# Patient Record
Sex: Female | Born: 1965 | Race: White | Hispanic: No | Marital: Married
Health system: Southern US, Community
[De-identification: ages and names within clinical notes are randomized; demographics above are authoritative.]

---

## 2018-02-08 ENCOUNTER — Emergency Department (INDEPENDENT_AMBULATORY_CARE_PROVIDER_SITE_OTHER): Payer: Managed Care, Other (non HMO)

## 2018-02-08 ENCOUNTER — Telehealth: Payer: Self-pay

## 2018-02-08 ENCOUNTER — Other Ambulatory Visit: Payer: Self-pay

## 2018-02-08 ENCOUNTER — Emergency Department (INDEPENDENT_AMBULATORY_CARE_PROVIDER_SITE_OTHER)
Admission: EM | Admit: 2018-02-08 | Discharge: 2018-02-08 | Disposition: A | Discharge status: Home / Self Care | Payer: Managed Care, Other (non HMO) | Source: Home / Self Care | Attending: Emergency Medicine | Admitting: Emergency Medicine

## 2018-02-08 DIAGNOSIS — R05 Cough: Secondary | ICD-10-CM | POA: Diagnosis not present

## 2018-02-08 DIAGNOSIS — R69 Illness, unspecified: Secondary | ICD-10-CM

## 2018-02-08 DIAGNOSIS — R5383 Other fatigue: Secondary | ICD-10-CM

## 2018-02-08 DIAGNOSIS — R509 Fever, unspecified: Secondary | ICD-10-CM

## 2018-02-08 DIAGNOSIS — R822 Biliuria: Secondary | ICD-10-CM

## 2018-02-08 DIAGNOSIS — R5381 Other malaise: Secondary | ICD-10-CM | POA: Diagnosis not present

## 2018-02-08 DIAGNOSIS — J111 Influenza due to unidentified influenza virus with other respiratory manifestations: Secondary | ICD-10-CM

## 2018-02-08 LAB — POCT URINALYSIS DIP (MANUAL ENTRY)
Blood, UA: NEGATIVE
Glucose, UA: NEGATIVE mg/dL
Ketones, POC UA: NEGATIVE mg/dL
LEUKOCYTES UA: NEGATIVE
Nitrite, UA: NEGATIVE
PH UA: 8.5 — AB (ref 5.0–8.0)
Protein Ur, POC: 30 mg/dL — AB
Spec Grav, UA: 1.02 (ref 1.010–1.025)
Urobilinogen, UA: 0.2 E.U./dL

## 2018-02-08 LAB — POCT INFLUENZA A/B
INFLUENZA B, POC: NEGATIVE
Influenza A, POC: NEGATIVE

## 2018-02-08 LAB — POCT RAPID STREP A (OFFICE): Rapid Strep A Screen: NEGATIVE

## 2018-02-08 MED ORDER — OSELTAMIVIR PHOSPHATE 75 MG PO CAPS: 75.0000 mg | ORAL_CAPSULE | Freq: Two times a day (BID) | ORAL | 0 refills | Status: AC

## 2018-02-08 MED ORDER — BENZONATATE 100 MG PO CAPS: 100.0000 mg | ORAL_CAPSULE | Freq: Three times a day (TID) | ORAL | 0 refills | Status: AC

## 2018-02-08 NOTE — Telephone Encounter (Signed)
Per Dr Ellis Parents request, called patient to request that she make an appointment with PCP Monday to have a UA repeated.  Pt agreed to call PCP and go in for test.

## 2018-02-08 NOTE — ED Triage Notes (Signed)
Started Wednesday with Fatigue, and fever.  Felt a little better yesterday, and worse today,

## 2018-02-08 NOTE — Discharge Instructions (Addendum)
Take Tamiflu as instructed. If you have worsening shortness of breath or chest pain please present to the emergency room. Take Tylenol for fever. You have Tessalon Perles for cough. Please have your urine rechecked next week to be sure the bilirubinuria has resolved.

## 2018-02-08 NOTE — ED Provider Notes (Signed)
[facility]    CSN: [id number] Arrival date & time: 02/08/18  [id number]     History   Chief Complaint Chief Complaint  Patient presents with  . Fever  . Fatigue    HPI Diane Leach is a 53 y.o. female.    Fever  Associated symptoms: chills, cough and sore throat   Patient has been traveling.  She was seen last week diagnosed with urinary tract infection and placed on Macrobid for 10 days.  She enters today with onset Wednesday afternoon of aching fever sore throat and a dry nonproductive cough.  She did not have a flu shot this year.  [place] flew back on Wednesday from a [place] in [place].  History reviewed. No pertinent past medical history.  There are no active problems to display for this patient.   History reviewed. No pertinent surgical history.  OB History   No obstetric history on file.      Home Medications    Prior to Admission medications   Medication Sig Start Date End Date Taking? Authorizing Provider  Cholecalciferol (VITAMIN D3) 25 MCG (1000 UT) CAPS Take by mouth. 09/04/17  Yes [provider]  fenofibrate (TRICOR) 145 MG tablet Take by mouth. 10/05/17  Yes [provider]  benzonatate (TESSALON) 100 MG capsule Take 1 capsule (100 mg total) by mouth every 8 (eight) hours. 02/08/18   Collene Gobbleaub, Steven A, MD  oseltamivir (TAMIFLU) 75 MG capsule Take 1 capsule (75 mg total) by mouth every 12 (twelve) hours. 02/08/18   Collene Gobbleaub, Steven A, MD  Pregnenolone Micronized POWD by Does not apply route.    [provider]  thyroid (ARMOUR) 65 MG tablet Take by mouth.    [provider]  Zinc Methionate 50 MG CAPS Take by mouth.    [provider]    Family History History reviewed. No pertinent family history.  Social History Social History   Tobacco Use  . Smoking status: Not on file  Substance Use Topics  . Alcohol use: Not on file  . Drug use: Not on file     Allergies   Patient has no allergy information on  record.   Review of Systems Review of Systems  Constitutional: Positive for chills and fever.  HENT: Positive for sore throat.   Eyes: Negative.   Respiratory: Positive for cough. Negative for shortness of breath.      Physical Exam Triage Vital Signs ED Triage Vitals  Enc Vitals Group     BP 02/08/18 0929 133/67     Pulse Rate 02/08/18 0929 (!) 113     Resp 02/08/18 0929 18     Temp 02/08/18 0929 99 F (37.2 C)     Temp Source 02/08/18 0929 Oral     SpO2 02/08/18 0929 97 %     Weight 02/08/18 0930 178 lb (80.7 kg)     Height 02/08/18 0930 5\' 9"  (1.753 m)     Head Circumference --      Peak Flow --      Pain Score 02/08/18 0930 8     Pain Loc --      Pain Edu? --      Excl. in GC? --    No data found.  Updated Vital Signs BP 133/67 (BP Location: Right Arm)   Pulse (!) 113   Temp 99 F (37.2 C) (Oral)   Resp 18   Ht 5\' 9"  (1.753 m)   Wt 80.7 kg   LMP  02/03/2018   SpO2 97%   BMI 26.29 kg/m   Visual Acuity Right Eye Distance:   Left Eye Distance:   Bilateral Distance:    Right Eye Near:   Left Eye Near:    Bilateral Near:     Physical Exam Constitutional:      Appearance: Normal appearance.  HENT:     Right Ear: Tympanic membrane normal.     Left Ear: Tympanic membrane normal.     Nose: Nose normal.     Mouth/Throat:     Mouth: Mucous membranes are moist.     Pharynx: Oropharynx is clear.  Eyes:     Pupils: Pupils are equal, round, and reactive to light.  Cardiovascular:     Rate and Rhythm: Normal rate and regular rhythm.  Pulmonary:     Effort: Pulmonary effort is normal.     Breath sounds: Normal breath sounds. No rales.  Neurological:     Mental Status: She is alert.      UC Treatments / Results  Labs (all labs ordered are listed, but only abnormal results are displayed) Labs Reviewed  POCT URINALYSIS DIP (MANUAL ENTRY) - Abnormal; Notable for the following components:      Result Value   Color, UA brown (*)    Clarity, UA cloudy  (*)    Bilirubin, UA small (*)    pH, UA 8.5 (*)    Protein Ur, POC =30 (*)    All other components within normal limits  POCT RAPID STREP A (OFFICE)  POCT INFLUENZA A/B    EKG None  Radiology Dg Abdomen Acute W/chest  Result Date: 02/08/2018 CLINICAL DATA:  53 year old female with a history of fever and cough EXAM: DG ABDOMEN ACUTE W/ 1V CHEST COMPARISON:  None. FINDINGS: Chest: Cardiomediastinal silhouette within normal limits. No evidence of central vascular congestion. No pneumothorax or pleural effusion. No confluent airspace disease. Abdomen: Gas within stomach, small bowel, colon. No abnormal distention. No abnormal calcifications or soft tissue density. No radiopaque foreign body. No acute displaced fracture IMPRESSION: Chest: No radiographic evidence of acute cardiopulmonary disease. Abdomen: Nonobstructive bowel gas pattern. Electronically Signed   By: Gilmer Mor D.O.   On: 02/08/2018 10:27    Procedures Procedures (including critical care time)  Medications Ordered in UC Medications - No data to display  Initial Impression / Assessment and Plan / UC Course  I have reviewed the triage vital signs and the nursing notes. Patient presents with flulike symptoms.  Flu test was negative strep test was negative.  She has been on Macrobid which raises the question of hypersensitivity pneumonitis.  I did check a urine which showed small bilirubin trace protein otherwise unremarkable so she can stop her Macrobid.  We will go ahead and do a chest x-ray and if negative treat with Tamiflu..and  Jerilynn Som given for cough. Pertinent labs & imaging results that were available during my care of the patient were reviewed by me and considered in my medical decision making (see chart for details).  Patient was to have a chest x-ray but had a three-way abdomen instead.  Lungs appeared normal on the one view of the chest.  There was no evidence of pneumonitis on that film.  I advised the  patient that it was our mistake that the abdominal film was taken.  Patient also have 1+ bilirubin on her urine.  She was advised to have this repeated next week.  She had no abdominal pain or GI symptoms.  We spoke with the patient and she agreed to see her PCP on Monday for follow-up urine.      Final Clinical Impressions(s) / UC Diagnoses   Final diagnoses:  Malaise and fatigue  Influenza-like illness  Bilirubinuria     Discharge Instructions     Take Tamiflu as instructed. If you have worsening shortness of breath or chest pain please present to the emergency room. Take Tylenol for fever. You have Tessalon Perles for cough. Please have your urine rechecked next week to be sure the bilirubinuria has resolved.    ED Prescriptions    Medication Sig Dispense Auth. Provider   oseltamivir (TAMIFLU) 75 MG capsule Take 1 capsule (75 mg total) by mouth every 12 (twelve) hours. 10 capsule Collene Gobbleaub, Steven A, MD   benzonatate (TESSALON) 100 MG capsule Take 1 capsule (100 mg total) by mouth every 8 (eight) hours. 21 capsule Collene Gobbleaub, Steven A, MD     Controlled Substance Prescriptions [place] Controlled Substance Registry consulted? Not Applicable   Collene Gobbleaub, Steven A, MD 02/08/18 1438

## 2019-06-30 IMAGING — DX DG ABDOMEN ACUTE W/ 1V CHEST
4 series · 4 of 4 positions shown · non-contrast
Comparison: None.

CLINICAL DATA: 52-year-old female with a history of fever and cough

EXAM:
DG ABDOMEN ACUTE W/ 1V CHEST

[chest pa]
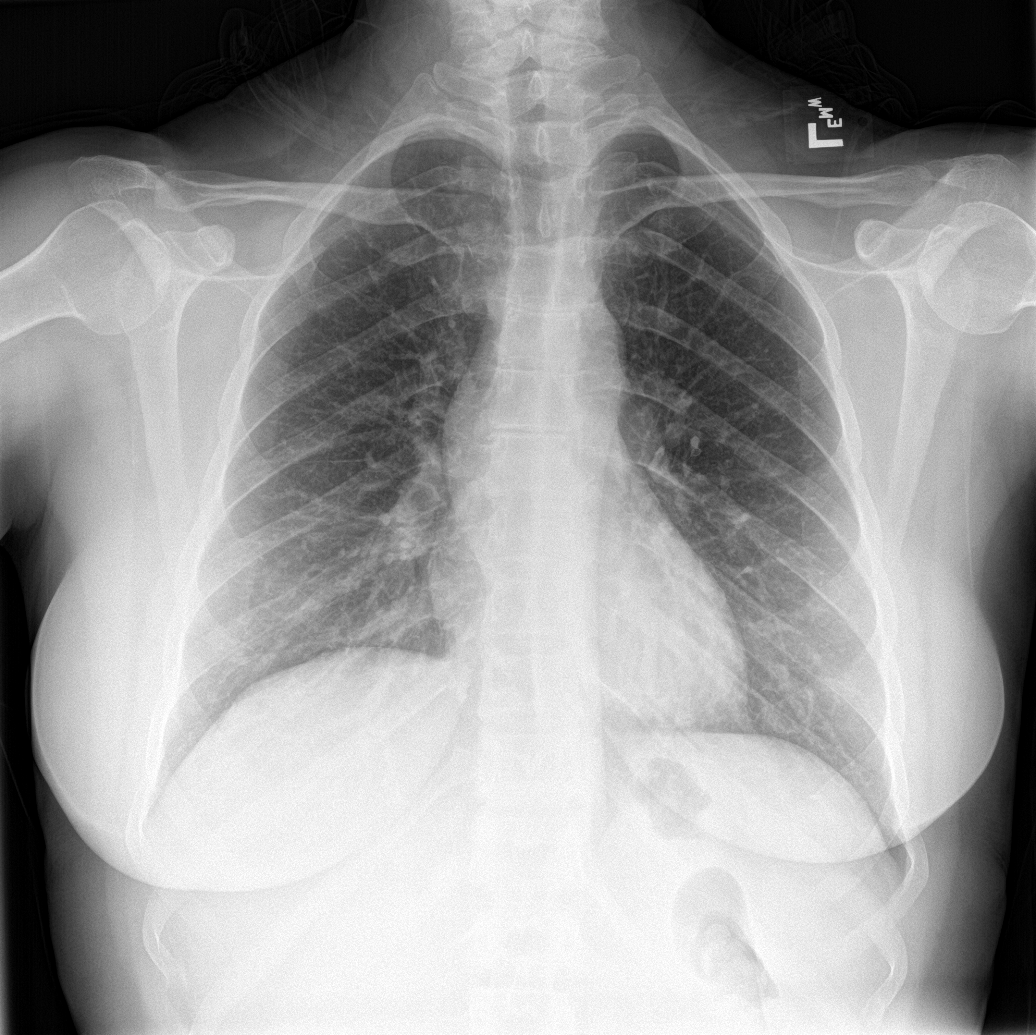

[abdomen erect]
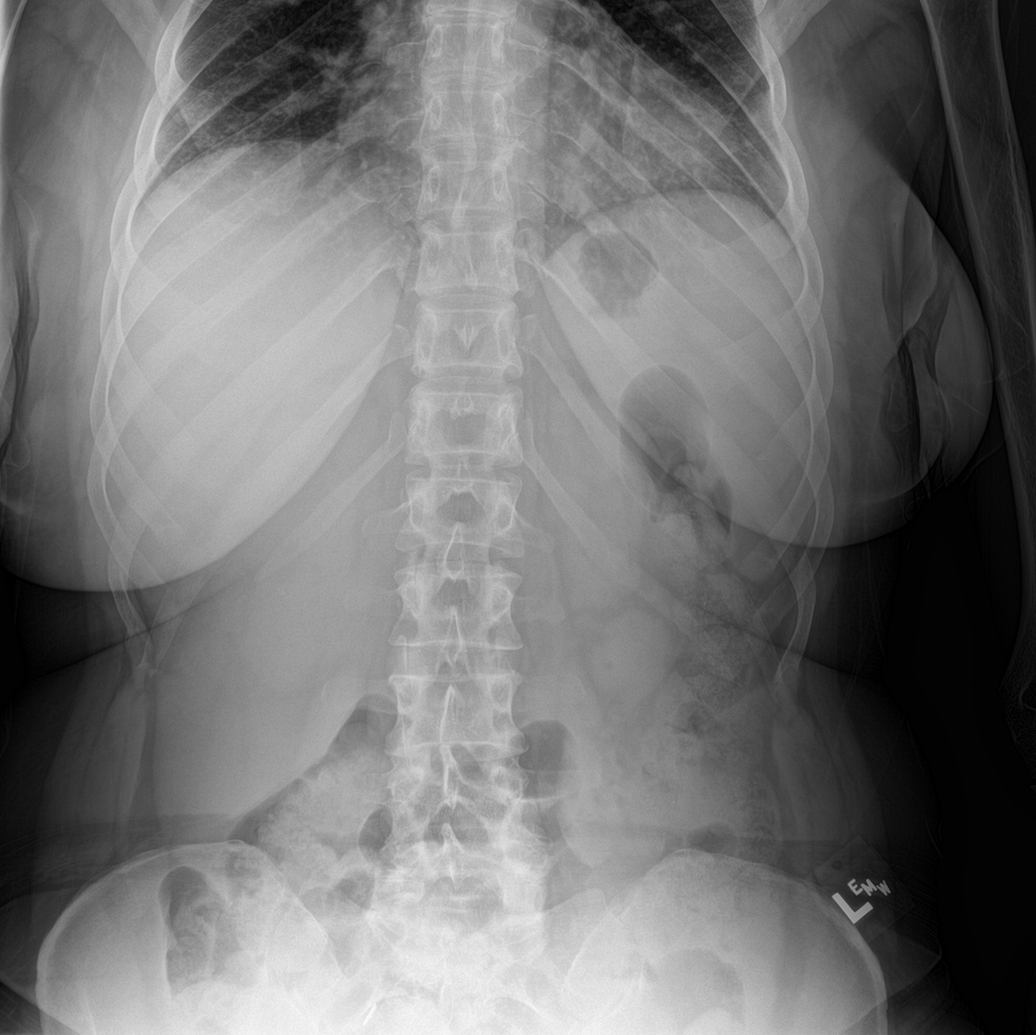

[abdomen supine (1 of 2)]
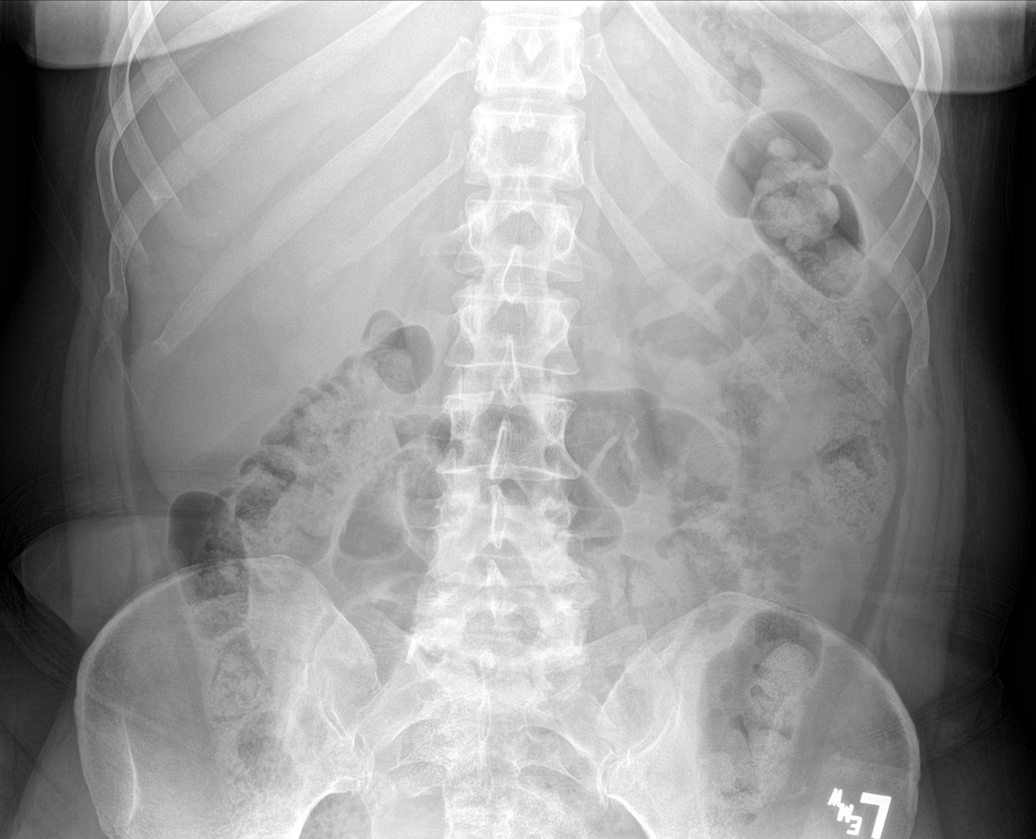

[abdomen supine (2 of 2)]
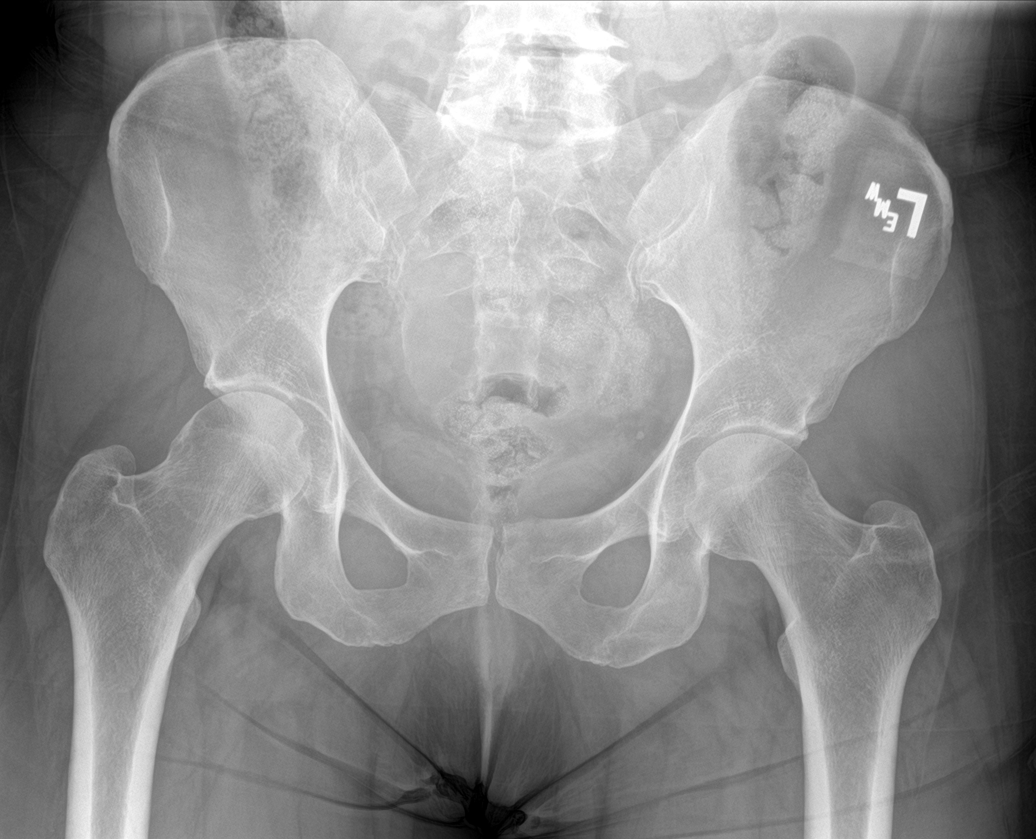

[4 of 4 positions shown; findings below may reference images not displayed]

FINDINGS: Chest:

Cardiomediastinal silhouette within normal limits. No evidence of
central vascular congestion. No pneumothorax or pleural effusion. No
confluent airspace disease.

Abdomen:

Gas within stomach, small bowel, colon. No abnormal distention. No
abnormal calcifications or soft tissue density. No radiopaque
foreign body.

No acute displaced fracture
IMPRESSION: Chest:

No radiographic evidence of acute cardiopulmonary disease.

Abdomen:

Nonobstructive bowel gas pattern.
